# Patient Record
Sex: Male | Born: 2000 | Race: White | Hispanic: No | Marital: Single | Smoking: Never smoker
Health system: Southern US, Community
[De-identification: ages and names within clinical notes are randomized; demographics above are authoritative.]

## PROBLEM LIST (undated history)

## (undated) DIAGNOSIS — G47419 Narcolepsy without cataplexy: Secondary | ICD-10-CM

## (undated) DIAGNOSIS — F909 Attention-deficit hyperactivity disorder, unspecified type: Secondary | ICD-10-CM

## (undated) DIAGNOSIS — F988 Other specified behavioral and emotional disorders with onset usually occurring in childhood and adolescence: Secondary | ICD-10-CM

## (undated) HISTORY — PX: CIRCUMCISION: SUR203

## (undated) HISTORY — PX: DENTAL SURGERY: SHX609

## (undated) HISTORY — PX: TYMPANOSTOMY TUBE PLACEMENT: SHX32

## (undated) HISTORY — PX: ADENOIDECTOMY: SUR15

---
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
## 2021-08-19 ENCOUNTER — Encounter: Payer: Self-pay | Admitting: Emergency Medicine

## 2021-08-19 ENCOUNTER — Emergency Department
Admission: EM | Admit: 2021-08-19 | Discharge: 2021-08-19 | Disposition: A | Discharge status: Home / Self Care | Payer: Medicaid Other | Attending: Emergency Medicine | Admitting: Emergency Medicine

## 2021-08-19 DIAGNOSIS — Z20822 Contact with and (suspected) exposure to covid-19: Secondary | ICD-10-CM | POA: Diagnosis not present

## 2021-08-19 DIAGNOSIS — R509 Fever, unspecified: Secondary | ICD-10-CM | POA: Diagnosis present

## 2021-08-19 DIAGNOSIS — J069 Acute upper respiratory infection, unspecified: Secondary | ICD-10-CM | POA: Insufficient documentation

## 2021-08-19 LAB — RESP PANEL BY RT-PCR (FLU A&B, COVID) ARPGX2
Influenza A by PCR: NEGATIVE
Influenza B by PCR: NEGATIVE
SARS Coronavirus 2 by RT PCR: NEGATIVE

## 2021-08-19 MED ORDER — ONDANSETRON 4 MG PO TBDP: 4.0000 mg | ORAL_TABLET | Freq: Three times a day (TID) | ORAL | 0 refills | Status: DC | PRN

## 2021-08-19 MED ORDER — GUAIFENESIN-CODEINE 100-10 MG/5ML PO SOLN: 5.0000 mL | Freq: Four times a day (QID) | ORAL | 0 refills | Status: DC | PRN

## 2021-08-19 NOTE — ED Provider Notes (Signed)
[facility] Emergency Department Provider Note  Time seen: 2:26 PM  I have reviewed the triage vital signs and the nursing notes.   HISTORY  Chief Complaint URI   HPI Connor Hernandez is a 20 y.o. male with a past medical history of ADHD who presents to the emergency department for cough congestion and subjective fever.  According to the patient for the past 4 days or so he has had subjective fever cough congestion initially with nausea and vomiting but that stopped several days ago.  Denies any chest pain or abdominal pain.  No known sick contacts.  No prior history of COVID.   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 Authorizing Provider  cephALEXin (KEFLEX) 250 MG/5ML suspension Take 10 mLs (500 mg total) by mouth 4 (four) times daily. 01/16/17   Cuthriell, Delorise Royals, PA-C  doxycycline (VIBRA-TABS) 100 MG tablet Take 1 tablet (100 mg total) by mouth 2 (two) times daily. 08/03/18   Felecia Shelling, DPM  ferrous sulfate 324 (65 Fe) MG TBEC Take 1 tablet by mouth 2 (two) times daily. Reported on 04/17/2016 11/29/15   [provider]  gentamicin cream (GARAMYCIN) 0.1 % Apply 1 application topically 2 (two) times daily. 07/13/18   Felecia Shelling, DPM  montelukast (SINGULAIR) 10 MG tablet Take 10 mg by mouth at bedtime. Reported on 04/17/2016    [provider]    No Known Allergies  Family History  Problem Relation Age of Onset   ADD / ADHD Brother    Migraines Neg Hx    Seizures Neg Hx     Depression Neg Hx    Anxiety disorder Neg Hx    Bipolar disorder Neg Hx    Schizophrenia Neg Hx    Autism Neg Hx     Social History Social History   Tobacco Use   Smoking status: Never   Smokeless tobacco: Never  Substance Use Topics   Alcohol use: No   Drug use: No    Review of Systems Constitutional: Subjective fever, but no temperature measured. Cardiovascular: Negative for chest pain. Respiratory: Slight shortness of breath at times.  Positive for cough.  Positive for congestion. Gastrointestinal: Negative for abdominal pain.  Nausea vomiting several days ago, none since Musculoskeletal: Negative for musculoskeletal complaints Neurological: Negative for headache All other ROS negative  ____________________________________________   PHYSICAL EXAM:  VITAL SIGNS: ED Triage Vitals  Enc Vitals Group     BP 08/19/21 1311 (!) 157/99     Pulse Rate 08/19/21 1311 61     Resp 08/19/21 1311 20     Temp 08/19/21 1311 98.4 F (36.9 C)     Temp Source 08/19/21 1311 Oral     SpO2 08/19/21 1311 98 %     Weight 08/19/21 1209 145 lb 1 oz (65.8 kg)     Height 08/19/21 1209 6\' 2"  (1.88 m)     Head Circumference --  Peak Flow --      Pain Score 08/19/21 1209 0     Pain Loc --      Pain Edu? --      Excl. in GC? --    Constitutional: Alert and oriented. Well appearing and in no distress. Eyes: Normal exam ENT      Head: Normocephalic and atraumatic.      Mouth/Throat: Mucous membranes are moist. Cardiovascular: Normal rate, regular rhythm.  Respiratory: Normal respiratory effort without tachypnea nor retractions. Breath sounds are clear  Gastrointestinal: Soft and nontender. No distention. Musculoskeletal: Nontender with normal range of motion in all extremities.  Neurologic:  Normal speech and language. No gross focal neurologic deficits Skin:  Skin is warm, dry and intact.  Psychiatric: Mood and affect are  normal.  ____________________________________________   INITIAL IMPRESSION / ASSESSMENT AND PLAN / ED COURSE  Pertinent labs & imaging results that were available during my care of the patient were reviewed by me and considered in my medical decision making (see chart for details).   Patient presents emergency department for cough congestion subjective fever and nausea intermittent over the past 4 days.  Symptoms are very suggestive of viral upper respiratory infection such as COVID or influenza.  Overall patient appears quite well, reassuring physical exam with clear lung sounds, normal vitals besides slight hypertension.  We will check for COVID/flu.  We will prescribe a codeine-based cough medication for the patient describes supportive care at home including Tylenol/ibuprofen fluids and rest.  Patient will follow-up on the COVID/flu test on MyChart.  Connor Hernandez was evaluated in Emergency Department on 08/19/2021 for the symptoms described in the history of present illness. He was evaluated in the context of the global COVID-19 pandemic, which necessitated consideration that the patient might be at risk for infection with the SARS-CoV-2 virus that causes COVID-19. Institutional protocols and algorithms that pertain to the evaluation of patients at risk for COVID-19 are in a state of rapid change based on information released by regulatory bodies including the CDC and federal and state organizations. These policies and algorithms were followed during the patient's care in the ED.  ____________________________________________   FINAL CLINICAL IMPRESSION(S) / ED DIAGNOSES  Upper respiratory infection   Minna Antis, MD 08/19/21 1428

## 2021-08-19 NOTE — Discharge Instructions (Signed)
As we discussed please follow-up with your COVID/flu results on MyChart.  Return to the emergency department for any trouble breathing or any other symptom personally concerning to yourself.  Please take your medications as needed as prescribed.

## 2021-08-19 NOTE — ED Triage Notes (Signed)
C/O nasal congestion, sore throat, fever, cough since Thursday.

## 2024-04-12 ENCOUNTER — Other Ambulatory Visit: Payer: Self-pay

## 2024-04-12 DIAGNOSIS — K029 Dental caries, unspecified: Secondary | ICD-10-CM | POA: Insufficient documentation

## 2024-04-12 DIAGNOSIS — K0889 Other specified disorders of teeth and supporting structures: Secondary | ICD-10-CM | POA: Diagnosis present

## 2024-04-12 NOTE — ED Triage Notes (Signed)
 Pt reports left side dental pain due to cavities, pt reports he has dentist appointment in October.

## 2024-04-13 ENCOUNTER — Emergency Department
Admission: EM | Admit: 2024-04-13 | Discharge: 2024-04-13 | Disposition: A | Discharge status: Home / Self Care | Payer: MEDICAID | Attending: Emergency Medicine | Admitting: Emergency Medicine

## 2024-04-13 DIAGNOSIS — K029 Dental caries, unspecified: Secondary | ICD-10-CM

## 2024-04-13 MED ORDER — PENICILLIN V POTASSIUM 500 MG PO TABS: 500.0000 mg | ORAL_TABLET | Freq: Four times a day (QID) | ORAL | 0 refills | Status: AC

## 2024-04-13 NOTE — Discharge Instructions (Addendum)
 Take naproxen (Aleve) 500mg  twice daily along with Tylenol  1000mg  three times per day to help control your dental pain.

## 2024-04-13 NOTE — ED Provider Notes (Signed)
   [facility] Provider Note    Event Date/Time   First MD Initiated Contact with Patient 04/13/24 0214     (approximate)   History   Dental Pain   HPI  Connor Hernandez is a 23 y.o. male with no significant past medical history comes ED complaining of dental pain.  He has known dental caries.  He made appointment with dentistry, which is in October.  No trouble swallowing or breathing, no neck pain or fever.  No vision changes or headache.     Physical Exam   Triage Vital Signs: ED Triage Vitals  Encounter Vitals Group     BP 04/12/24 2207 (!) 138/90     Girls Systolic BP Percentile --      Girls Diastolic BP Percentile --      Boys Systolic BP Percentile --      Boys Diastolic BP Percentile --      Pulse Rate 04/12/24 2207 70     Resp 04/12/24 2207 18     Temp 04/12/24 2207 98.7 F (37.1 C)     Temp src --      SpO2 04/12/24 2207 100 %     Weight 04/12/24 2206 140 lb (63.5 kg)     Height 04/12/24 2206 6' 3 (1.905 m)     Head Circumference --      Peak Flow --      Pain Score 04/12/24 2206 8     Pain Loc --      Pain Education --      Exclude from Growth Chart --     Most recent vital signs: Vitals:   04/12/24 2207  BP: (!) 138/90  Pulse: 70  Resp: 18  Temp: 98.7 F (37.1 C)  SpO2: 100%    General: Awake, no distress.  CV:  Good peripheral perfusion.  Resp:  Normal effort.  Abd:  No distention.  Other:  No gingival swelling.  Multifocal dental decay   ED Results / Procedures / Treatments   Labs (all labs ordered are listed, but only abnormal results are displayed) Labs Reviewed - No data to display   RADIOLOGY    PROCEDURES:  Procedures   MEDICATIONS ORDERED IN ED: Medications - No data to display   IMPRESSION / MDM / ASSESSMENT AND PLAN / ED COURSE  I reviewed the triage vital signs and the nursing notes.                              Patient presents with dental pain related to caries.  No airway  compromise, doubt PTA or RPA.  No apparent abscess or secondary space cellulitis.  Patient is very worried about infection, despite reassurance.  Will provide a prescription for penicillin for now while he continues to pursue dental follow-up.     FINAL CLINICAL IMPRESSION(S) / ED DIAGNOSES   Final diagnoses:  Dental caries     Rx / DC Orders   ED Discharge Orders     None        Note:  This document was prepared using Dragon voice recognition software and may include unintentional dictation errors.   Viviann Pastor, MD 04/13/24 [phone]

## 2024-07-12 ENCOUNTER — Ambulatory Visit: Payer: MEDICAID

## 2024-07-12 VITALS — BP 120/78 | HR 90 | Resp 16 | Ht 74.0 in | Wt 153.0 lb

## 2024-07-12 DIAGNOSIS — F410 Panic disorder [episodic paroxysmal anxiety] without agoraphobia: Secondary | ICD-10-CM

## 2024-07-12 DIAGNOSIS — Z720 Tobacco use: Secondary | ICD-10-CM

## 2024-07-12 DIAGNOSIS — F411 Generalized anxiety disorder: Secondary | ICD-10-CM

## 2024-07-12 DIAGNOSIS — F321 Major depressive disorder, single episode, moderate: Secondary | ICD-10-CM

## 2024-07-12 MED ORDER — ESCITALOPRAM OXALATE 10 MG PO TABS: 10.0000 mg | ORAL_TABLET | Freq: Every day | ORAL | 3 refills | Status: AC

## 2024-07-12 MED ORDER — PROPRANOLOL HCL 10 MG PO TABS: 10.0000 mg | ORAL_TABLET | Freq: Three times a day (TID) | ORAL | 3 refills | Status: AC | PRN

## 2024-07-12 NOTE — Progress Notes (Unsigned)
 New patient visit  Patient: Connor Hernandez   DOB: 2001/05/24   23 y.o. Male  MRN: [id number] Visit Date: 07/12/2024  Today's healthcare provider: Isaiah DELENA Pepper, MD   Chief Complaint  Patient presents with   New Patient (Initial Visit)    NP/Anxiety   Subjective    Connor Hernandez is a 23 y.o. male who presents today as a new patient to establish care.   HPI:  Mood Disorder: - has been going on for the past year - reports panic attacks - some weeks he feels good, other weeks he doesn't - reports difficulty controlling his worrying - reports difficulty leaving house due to his anxiety - reports difficulty getting out of bed - does not have a full time job - quit smoking MJ since January  - his mother has depression - his father takes citalopram  Smoking/Vaping/Tobacco Products: Trying to stop vaping nicotine, currently every day.  Past Medical History:  Diagnosis Date   ADD (attention deficit disorder)    ADHD (attention deficit hyperactivity disorder)    Narcolepsy    Past Surgical History:  Procedure Laterality Date   ADENOIDECTOMY     CIRCUMCISION     DENTAL SURGERY     TYMPANOSTOMY TUBE PLACEMENT     Family Status  Relation Name Status   Mother  Alive   Father  Alive   Sister 57 yo Alive   Brother 41 yo Alive   MGM  Alive   MGF  Alive   PGM  Deceased   PGF  Alive   Sister 48 yo Alive   Sister 75 yo Alive   Neg Hx  (Not Specified)  No partnership data on file   Family History  Problem Relation Age of Onset   ADD / ADHD Brother    Migraines Neg Hx    Seizures Neg Hx    Depression Neg Hx    Anxiety disorder Neg Hx    Bipolar disorder Neg Hx    Schizophrenia Neg Hx    Autism Neg Hx    Social History   Socioeconomic History   Marital status: Single    Spouse name: Not on file   Number of children: Not on file   Years of education: Not on file   Highest education level: Not on file  Occupational History   Not on file  Tobacco Use    Smoking status: Never   Smokeless tobacco: Never  Substance and Sexual Activity   Alcohol use: No   Drug use: No   Sexual activity: Not on file  Other Topics Concern   Not on file  Social History Narrative   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 He lives with his father at this moment and will be coming and going from mother's home. Mother takes care of all medical appointments.          Connor Hernandez has an IEP in school; he was not meeting goals this past year.    Social Drivers of Corporate investment banker Strain: Low Risk  (11/06/2023)   Received from [facility]   Overall Financial Resource Strain (CARDIA)    Difficulty of Paying Living Expenses: Not hard at all  Food Insecurity: No Food Insecurity (11/06/2023)   Received from [facility]   Hunger Vital Sign    Within the past 12 months, you worried that your  food would run out before you got the money to buy more.: Never true    Within the past 12 months, the food you bought just didn't last and you didn't have money to get more.: Never true  Transportation Needs: No Transportation Needs (11/06/2023)   Received from [facility] Transportation    In the past 12 months, has lack of transportation kept you from medical appointments or from getting medications?: No    Lack of Transportation (Non-Medical): No  Physical Activity: Not on file  Stress: Not on file  Social Connections: Not on file   Outpatient Medications Prior to Visit  Medication Sig   ferrous sulfate 324 (65 Fe) MG TBEC Take 1 tablet by mouth 2 (two) times daily. Reported on 04/17/2016 (Patient not taking: Reported on 07/12/2024)   gentamicin  cream (GARAMYCIN ) 0.1 % Apply 1 application topically 2 (two) times daily. (Patient not taking: Reported on 07/12/2024)   guaiFENesin -codeine  100-10 MG/5ML syrup Take 5 mLs by mouth every 6 (six) hours  as needed for cough. (Patient not taking: Reported on 07/12/2024)   ondansetron  (ZOFRAN  ODT) 4 MG disintegrating tablet Take 1 tablet (4 mg total) by mouth every 8 (eight) hours as needed for nausea or vomiting. (Patient not taking: Reported on 07/12/2024)   [DISCONTINUED] cephALEXin  (KEFLEX ) 250 MG/5ML suspension Take 10 mLs (500 mg total) by mouth 4 (four) times daily. (Patient not taking: Reported on 07/12/2024)   [DISCONTINUED] doxycycline  (VIBRA -TABS) 100 MG tablet Take 1 tablet (100 mg total) by mouth 2 (two) times daily. (Patient not taking: Reported on 07/12/2024)   [DISCONTINUED] montelukast (SINGULAIR) 10 MG tablet Take 10 mg by mouth at bedtime. Reported on 04/17/2016 (Patient not taking: Reported on 07/12/2024)   No facility-administered medications prior to visit.   No Known Allergies  Reviews of Systems as noted in HPI.  {Insert previous labs (optional):23779} {See past labs  Heme  Chem  Endocrine  Serology  Results Review (optional):1}   Objective    BP 120/78 (BP Location: Right Arm, Patient Position: Sitting, Cuff Size: Normal)   Pulse 90   Resp 16   Ht 6' 2 (1.88 m)   Wt 153 lb (69.4 kg)   SpO2 100%   BMI 19.64 kg/m  {Insert last BP/Wt (optional):23777}{See vitals history (optional):1}   Physical Exam Constitutional:      Appearance: Normal appearance.  HENT:     Head: Normocephalic and atraumatic.     Mouth/Throat:     Mouth: Mucous membranes are moist.  Eyes:     Pupils: Pupils are equal, round, and reactive to light.  Pulmonary:     Effort: Pulmonary effort is normal.  Skin:    General: Skin is warm.  Neurological:     General: No focal deficit present.     Mental Status: He is alert.     Depression Screen    07/12/2024    4:35 PM  PHQ 2/9 Scores  PHQ - 2 Score 4  PHQ- 9 Score 9      07/12/2024    4:35 PM  Depression screen PHQ 2/9  Decreased Interest 2  Down, Depressed, Hopeless 2  PHQ - 2 Score 4  Altered sleeping 2  Tired, decreased  energy 1  Change in appetite 1  Feeling bad or failure about yourself  1  Trouble concentrating 0  Moving slowly or fidgety/restless 0  Suicidal thoughts 0  PHQ-9 Score 9  Difficult doing work/chores Somewhat difficult  07/12/2024    4:36 PM  GAD 7 : Generalized Anxiety Score  Nervous, Anxious, on Edge 2  Control/stop worrying 2  Worry too much - different things 2  Trouble relaxing 2  Restless 1  Easily annoyed or irritable 2  Afraid - awful might happen 1  Total GAD 7 Score 12  Anxiety Difficulty Somewhat difficult     No results found for any visits on 07/12/24.  Assessment & Plan      Problem List Items Addressed This Visit   None Visit Diagnoses       Generalized anxiety disorder    -  Primary   Relevant Medications   escitalopram  (LEXAPRO ) 10 MG tablet     Panic attacks       Relevant Medications   escitalopram  (LEXAPRO ) 10 MG tablet   propranolol  (INDERAL ) 10 MG tablet       Return in about 4 weeks (around 08/09/2024) for Follow Up.      Isaiah DELENA Pepper, MD  [facility] [phone] (phone) [phone] (fax)

## 2024-07-13 DIAGNOSIS — Z720 Tobacco use: Secondary | ICD-10-CM | POA: Insufficient documentation

## 2024-07-13 DIAGNOSIS — F410 Panic disorder [episodic paroxysmal anxiety] without agoraphobia: Secondary | ICD-10-CM | POA: Insufficient documentation

## 2024-07-13 DIAGNOSIS — F321 Major depressive disorder, single episode, moderate: Secondary | ICD-10-CM | POA: Insufficient documentation

## 2024-07-13 NOTE — Assessment & Plan Note (Signed)
 Vapes nicotine daily. Discussed strategies to cut back use. Patient is pre-contemplative.

## 2024-07-13 NOTE — Assessment & Plan Note (Addendum)
 Patient with several panic episodes when leaving his house in the past few months. Concern for panic attacks, social anxiety.  - Will start propranolol  TID PRN for panic attacks - Follow up in 1 month

## 2024-07-13 NOTE — Assessment & Plan Note (Signed)
 Chronic, uncontrolled. Reports difficulty getting out of bed, low energy, hypersomnia, irritability and anxiety. Has family hx of depression. Has not taken medication previously. - Will start lexapro  10mg  daily, discussed side effects and administration - Provided resource of [email or web address] to find a local therapist.  - Follow up in 1 month

## 2024-07-13 NOTE — Patient Instructions (Signed)
 To find a therapist, I recommend going to:  [email or web address]  You can filter your location, insurance, and specific topic that you would like to address.

## 2024-08-08 NOTE — Progress Notes (Deleted)
      Established patient visit   Patient: Connor Hernandez   DOB: 03-12-2001   23 y.o. Male  MRN: [id number] Visit Date: 08/09/2024  Today's healthcare provider: Isaiah DELENA Pepper, MD   No chief complaint on file.  Subjective    HPI  Discussed the use of AI scribe software for clinical note transcription with the patient, who gave verbal consent to proceed.  History of Present Illness      Medications: Outpatient Medications Prior to Visit  Medication Sig   escitalopram  (LEXAPRO ) 10 MG tablet Take 1 tablet (10 mg total) by mouth daily.   propranolol  (INDERAL ) 10 MG tablet Take 1 tablet (10 mg total) by mouth 3 (three) times daily as needed (panic attacks).   No facility-administered medications prior to visit.    Review of Systems as noted in HPI.  {Insert previous labs (optional):23779} {See past labs  Heme  Chem  Endocrine  Serology  Results Review (optional):1}   Objective    There were no vitals taken for this visit. {Insert last BP/Wt (optional):23777}{See vitals history (optional):1}  Physical Exam   No results found for any visits on 08/09/24.  Assessment & Plan     Problem List Items Addressed This Visit   None   Assessment and Plan Assessment & Plan       No follow-ups on file.       Isaiah DELENA Pepper, MD  [facility] [phone] (phone) [phone] (fax)

## 2024-08-09 ENCOUNTER — Ambulatory Visit: Payer: MEDICAID
